# Patient Record
Sex: Female | Born: 1980 | Race: Asian | Hispanic: No | Marital: Married | Smoking: Never smoker
Health system: Southern US, Community
[De-identification: ages and names within clinical notes are randomized; demographics above are authoritative.]

## PROBLEM LIST (undated history)

## (undated) DIAGNOSIS — E039 Hypothyroidism, unspecified: Secondary | ICD-10-CM

---

## 2002-01-30 ENCOUNTER — Inpatient Hospital Stay (HOSPITAL_COMMUNITY): Admission: EM | Admit: 2002-01-30 | Discharge: 2002-01-31 | Payer: Self-pay | Admitting: *Deleted

## 2008-01-26 ENCOUNTER — Other Ambulatory Visit: Admission: RE | Admit: 2008-01-26 | Discharge: 2008-01-26 | Payer: Self-pay | Admitting: Obstetrics & Gynecology

## 2008-04-09 ENCOUNTER — Emergency Department (HOSPITAL_COMMUNITY): Admission: EM | Admit: 2008-04-09 | Discharge: 2008-04-09 | Payer: Self-pay | Admitting: Emergency Medicine

## 2008-05-22 ENCOUNTER — Emergency Department (HOSPITAL_COMMUNITY): Admission: EM | Admit: 2008-05-22 | Discharge: 2008-05-22 | Payer: Self-pay | Admitting: Emergency Medicine

## 2008-07-17 ENCOUNTER — Ambulatory Visit: Payer: Self-pay | Admitting: Obstetrics & Gynecology

## 2008-07-17 ENCOUNTER — Inpatient Hospital Stay (HOSPITAL_COMMUNITY): Admission: AD | Admit: 2008-07-17 | Discharge: 2008-07-21 | Payer: Self-pay | Admitting: Obstetrics & Gynecology

## 2008-07-18 ENCOUNTER — Encounter: Payer: Self-pay | Admitting: Obstetrics & Gynecology

## 2010-04-19 ENCOUNTER — Ambulatory Visit (HOSPITAL_COMMUNITY): Admission: RE | Admit: 2010-04-19 | Discharge: 2010-04-19 | Payer: Self-pay | Admitting: Obstetrics & Gynecology

## 2010-10-22 ENCOUNTER — Encounter: Payer: Self-pay | Admitting: Internal Medicine

## 2011-02-10 IMAGING — US US OB NUCHAL TRANSLUCENCY 1ST GEST
1 series · 14 of 28 positions shown · non-contrast
Comparison: none

OBSTETRICAL ULTRASOUND:
 This ultrasound was performed in The [HOSPITAL], and the [facility] report will be stored to [REDACTED] PACS.  This report is also available in [HOSPITAL]?s accessANYware.

[Series 1: us ob nuchal translucency 1st gest · 0.24mm/px · 14 of 41 slices shown]
[im 2/41]
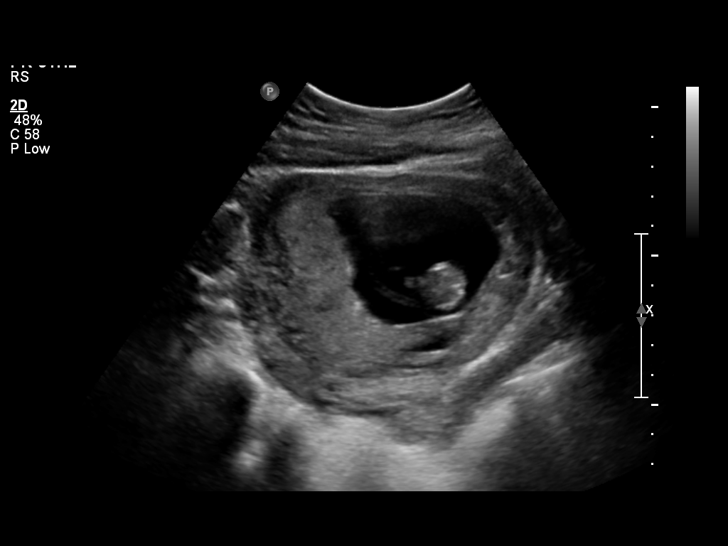
[im 5/41]
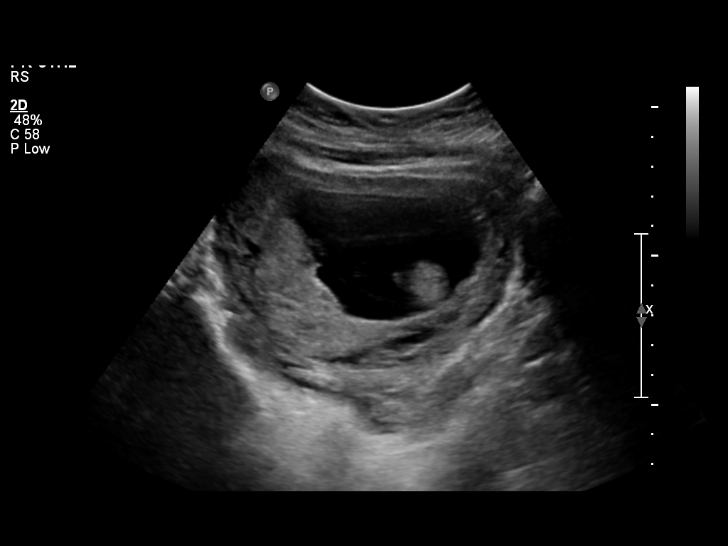
[im 8/41]
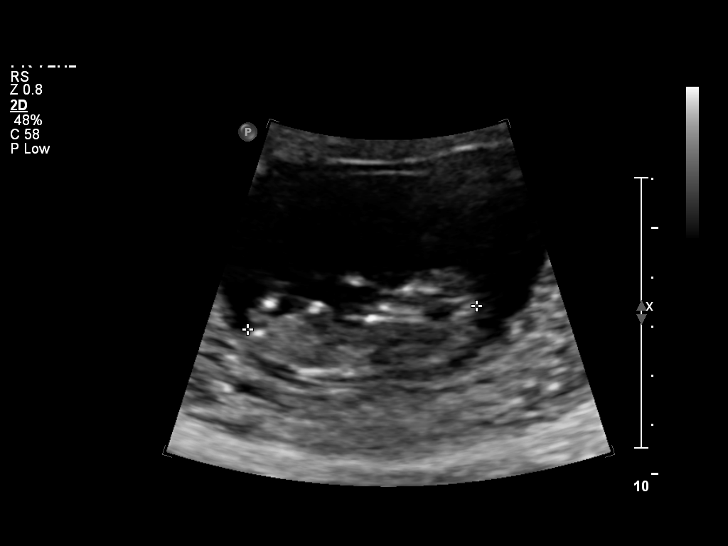
[im 11/41]
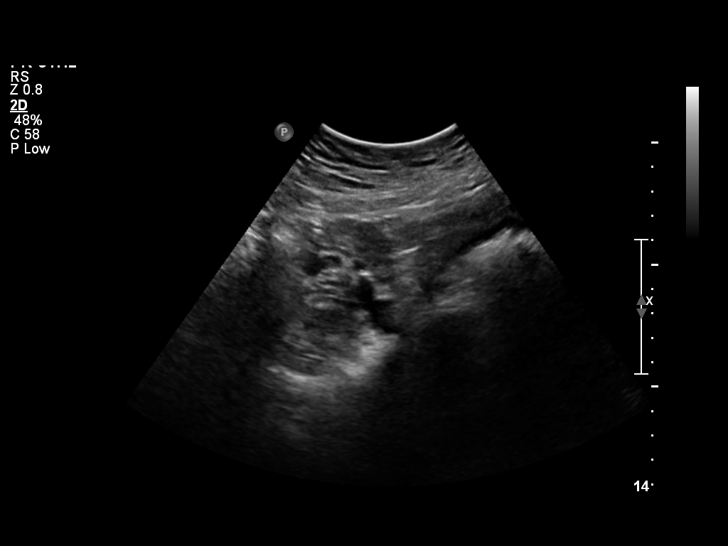
[im 14/41]
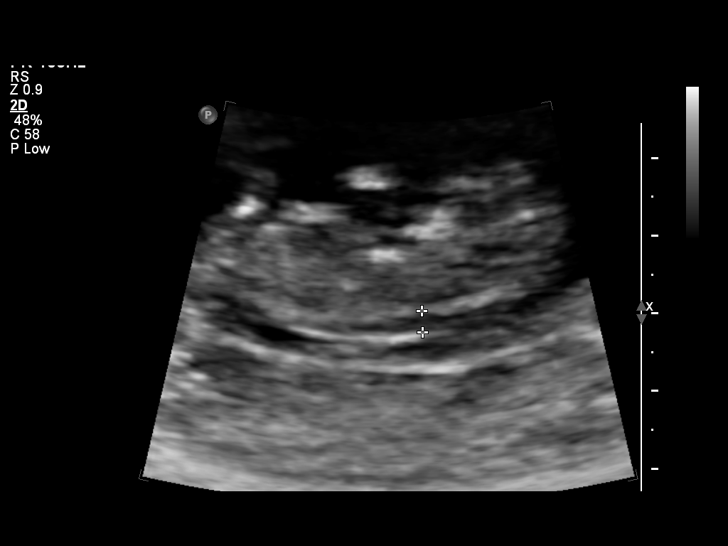
[im 17/41]
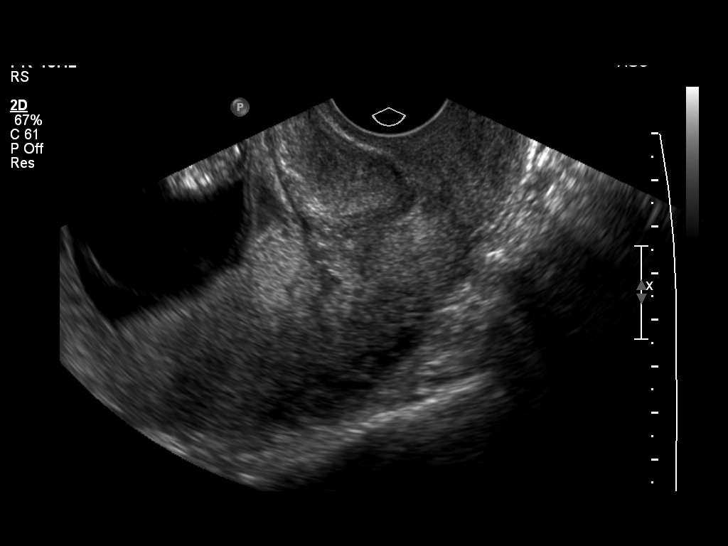
[im 20/41]
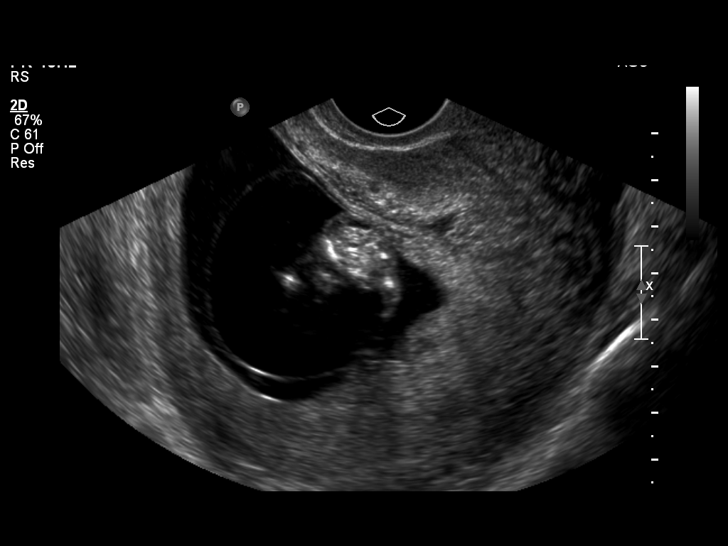
[im 23/41]
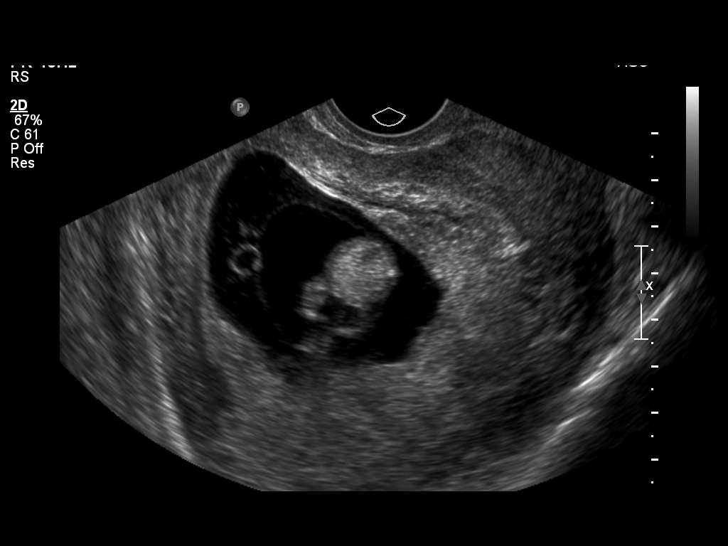
[im 26/41]
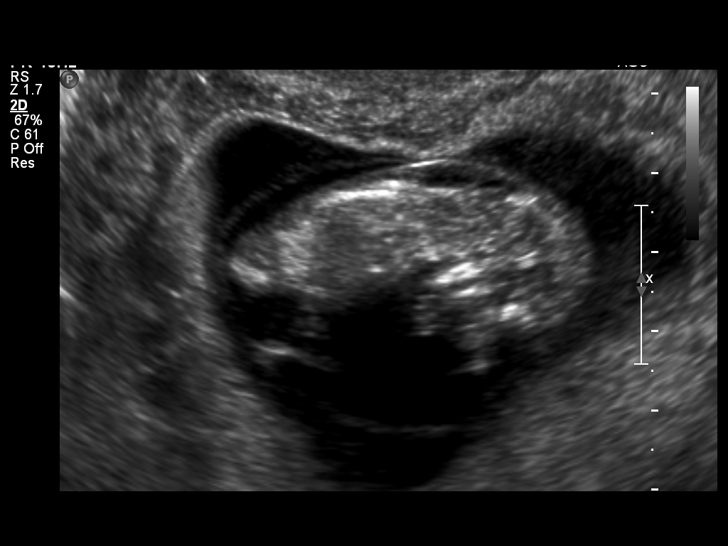
[im 29/41]
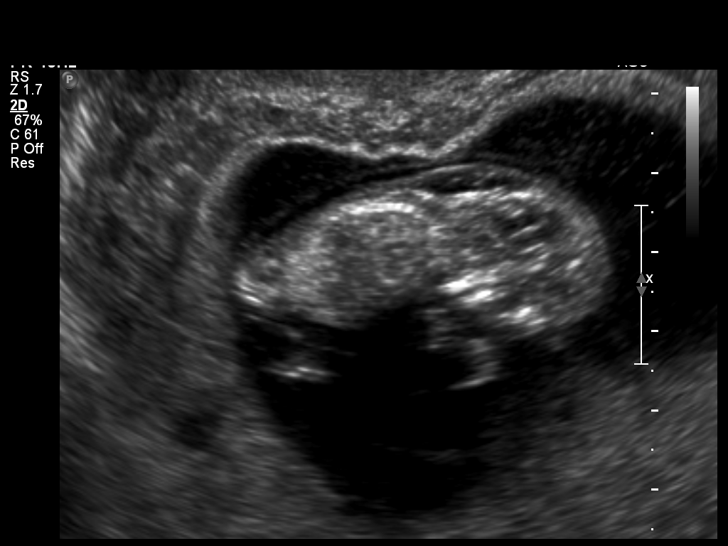
[im 32/41]
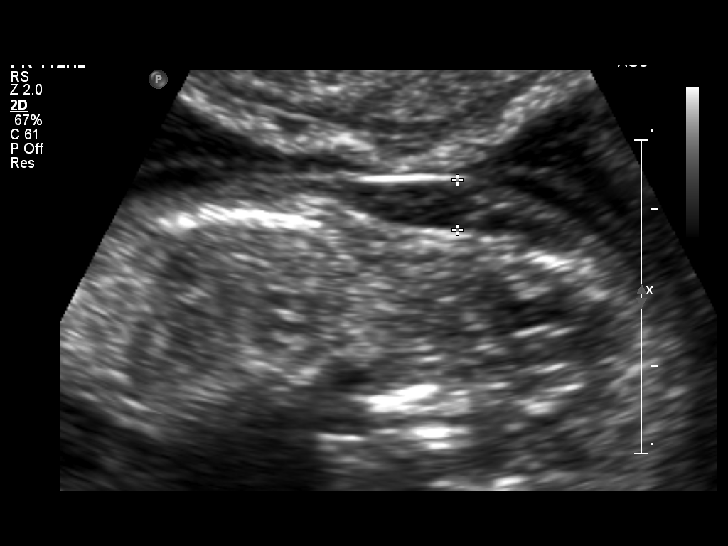
[im 35/41]
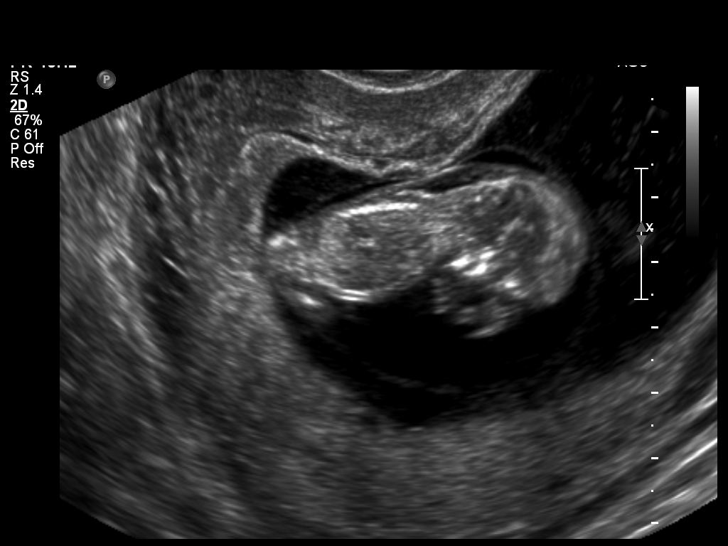
[im 38/41]
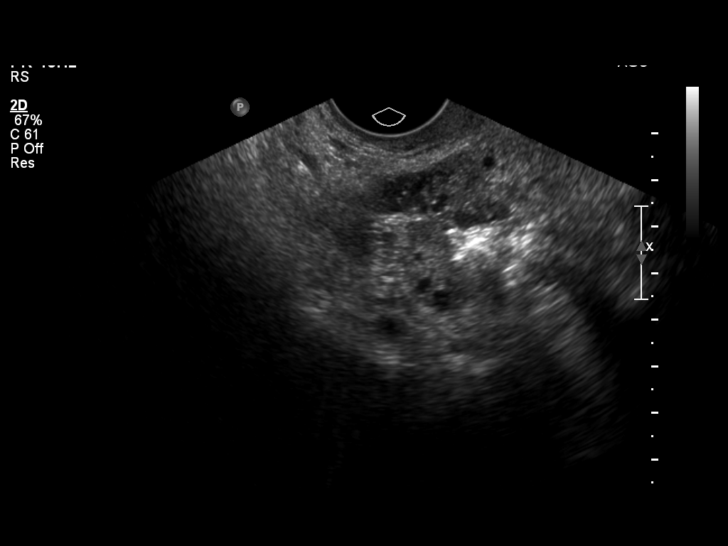
[im 41/41]
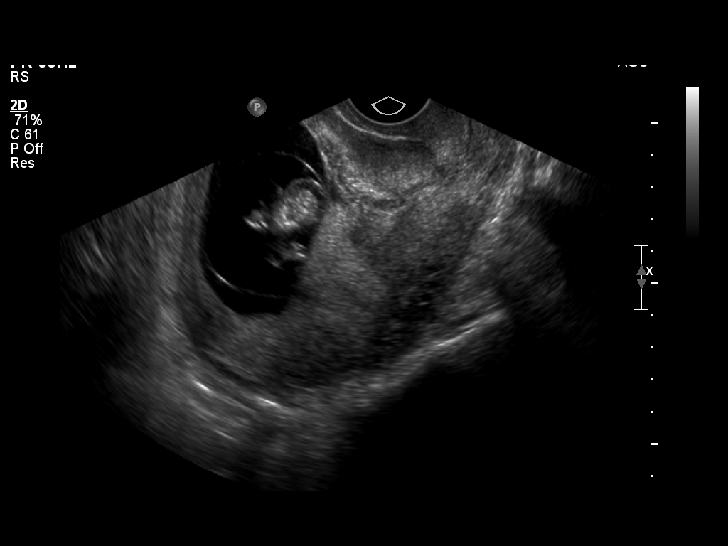

[14 of 28 positions shown; findings below may reference images not displayed]

IMPRESSION: [facility] has also been faxed to the ordering physician.

## 2011-02-13 NOTE — Op Note (Signed)
NAMEWINEFRED, Laura Berry NO.:  [id number]   MEDICAL RECORD NO.:  [id number]          PATIENT TYPE:  INP   LOCATION:  9164                          FACILITY:  [facility]   PHYSICIAN:  Lesly Dukes, M.D. DATE OF BIRTH:  20-Jan-1981   DATE OF PROCEDURE:  07/18/2008  DATE OF DISCHARGE:                               OPERATIVE REPORT   PREOPERATIVE DIAGNOSES:  A 30 year old  para 0 with failure to descent  after 3 hours of pushing and chorioamnionitis.   POSTOPERATIVE DIAGNOSES:  A 30 year old para 0 with failure to descent  after 3 hours of pushing and chorioamnionitis.   PROCEDURE:  Primary low-transverse cesarean section.   SURGEON:  Lesly Dukes, MD   ANESTHESIA:  Epidural.   SPECIMEN:  Placenta to Pathology.   FINDINGS:  Viable female infant, vertex presentation, occiput anterior.   ESTIMATED BLOOD LOSS:  1000 mL.   COMPLICATIONS:  None.   DESCRIPTION OF PROCEDURE:  After informed consent was obtained, the was  taken to the operating room where epidural anesthesia was found to be  adequate.  The patient was placed in dorsal supine position with  leftward tilt and prepped and draped in normal sterile fashion.  A Foley  was already in the bladder.  A Pfannenstiel skin incision was made with  a scalpel and carried down to fascia.  The fascia was incised in the  midline, this incision was extended bilaterally with Mayo scissors.  The  superior and inferior aspects of the fascial incision were grasped with  Kocher clamps, tented up, and dissected off sharply and bluntly from  underlying layers of rectus muscles.  The rectus muscles were separated  in the midline.  The peritoneum was entered bluntly and this incision  was extended both superiorly and inferiorly with good visualization of  the bladder.  The bladder blade was inserted and a bladder flap was  created.  The bladder blade was reinserted.  The lower uterine segment  was incised in a transverse fashion.   This incision was entered  bilaterally bluntly.  The head delivered direct OA without incident.  Nose and mouth were suctioned.  Rest of the baby's body delivered  easily.  The cord was clamped and cut.  The baby was handed off to the  awaiting pediatrician.  Placenta delivered manually and had a 3-vessel  cord.  The uterus was cleared of all clots and debris and was  exteriorized.  The uterine incision was closed with 0 Vicryl in a  running locked fashion with good hemostasis.  A second suture of chromic  was used to reinforce the incision and aid in hemostasis.  The  peritoneal cavity was copiously irrigated and there was still a small  amount of oozing near the bladder flap in lower uterine segment so  sutures of chromic and Vicryl were used to help aid in hemostasis.  A  Surgicel was placed in this area.  Again the uterus was noted to be  hemostatic at this point.  The rectus muscles and peritoneum appeared to  be hemostatic.  The fascia  was closed 0 Vicryl in a running fashion with  good hemostasis.  Subcutaneous tissue was  copiously irrigated and found to be hemostatic.  Skin was closed with  staples and a pressure dressing was placed in the abdomen.  The patient  tolerated the procedure well.  Sponge, lap, instrument, and needle count  were all correct x2.  The patient went to the recovery room in stable  condition.      Lesly Dukes, M.D.  Electronically Signed     KHL/MEDQ  D:  07/18/2008  T:  07/18/2008  Job:  [id number]

## 2011-02-13 NOTE — Discharge Summary (Signed)
NAMEINEZE, SERRAO NO.:  [id number]   MEDICAL RECORD NO.:  [id number]          PATIENT TYPE:  INP   LOCATION:  9121                          FACILITY:  [facility]   PHYSICIAN:  Lesly Dukes, M.D. DATE OF BIRTH:  [birth year]   DATE OF ADMISSION:  07/17/2008  DATE OF DISCHARGE:                               DISCHARGE SUMMARY   REASON FOR ADMISSION:  The patient is a gravida 1 presented at 38-4/7th  weeks in active labor.  She labored for quite a while, pushed 3 hours,  baby never came past, +2 station, and she developed a fever, so she  received primary low transverse cesarean section for failure to descend  and chorioamnionitis.   PERTINENT LABORATORY FINDINGS:  Upon admission, her white blood cell  count was 10.0.  At discharge is 13.5.  H&H on admission 11.4 and 34.2.  Discharge H&H is 9.6 and 28.6.  She is afebrile.  The C-section was  performed by Dr. Elsie Lincoln.  See operative report for details on  that.  There were no complications.  The patient's postop course has  been uneventful except for some mild wheezes, which have decreased  throughout her hospital stay.  She has no other respiratory symptoms and  no shortness of breath.  O2 sats were within normal limits.  See  progress note for this morning's physical exam.  The patient is in  stable condition to be discharged home.  She has been given written and  verbal instructions and on breast-feeding and postoperative care.  Please see med rec sheet for medications.  Her staples are going to be  removed today and she is going to follow up with [facility] in  next week.      Jacklyn Shell, C.N.M.      Lesly Dukes, M.D.  Electronically Signed    FC/MEDQ  D:  07/21/2008  T:  07/21/2008  Job:  [id number]   cc:   [facility]

## 2011-02-16 NOTE — Consult Note (Signed)
[facility]  Patient:    Laura Berry, Laura Berry Visit Number: [id number] MRN: [id number]          Service Type: MED Location: 3A A311 01 Attending Physician:  Teena Dunk Hor Dictated by:   Franky Macho, M.D. Proc. Date: 01/30/02 Admit Date:  01/30/2002 Discharge Date: 01/31/2002   CC:         Dr. Corey Skains, hospital list   Consultation Report  AGE:  30 years old  REASON FOR CONSULTATION:  Arthropod bite right forearm.  HISTORY OF PRESENT ILLNESS:  The patient is a 30 year old white female while sleeping last night, developed right forearm and hand swelling when she woke up this morning.  She was noted to have an arthropod bite just proximal to the wrist.  She now presents to the emergency room due to worsening swelling.  She states that she has some tingling sensations in her finger, but is in no pain.  She denies any fever.  She is otherwise healthy.  PAST MEDICAL HISTORY:  Unremarkable.  PAST SURGICAL HISTORY:  Unremarkable.  CURRENT MEDICATIONS:  None.  ALLERGIES:  No known drug allergies.  REVIEW OF SYSTEMS:  Unremarkable.  PHYSICAL EXAMINATION:  GENERAL:  The patient is a pleasant 30 year old female in no acute distress.  VITAL SIGNS:  She is afebrile and vital signs are stable.  EXTREMITIES:  Swollen right hand, wrist, and forearm.  Good radial and ulnar pulses are noted.  She has good capillary refill.  She has full movement of both the wrists and fingers in the right hand.  She does have an ecchymotic area around an arthropod bite that is along the radial aspect of the forearm. No fluctuant area is noted.  IMPRESSION:  Arthropod bite, right forearm.  At this point, I do not think she has an abscess cavity that requires incision and drainage.  She does not have evidence of compartmental syndrome or a necrotizing tendinitis type process. I suspect that this is a spider bite with resulting subcutaneous necrosis.  It appears to be mild in  nature.  PLAN:  Agree with admission to the hospital for neurovascular right arm checks, right arm elevation, and intravenous Ancef.  A CBC has been ordered but is not back yet.  I will follow the patient closely with you. Dictated by:   Franky Macho, M.D. Attending Physician:  Teena Dunk [facility] DD:  01/30/02 TD:  02/01/02 Job: [id number]

## 2011-02-16 NOTE — Discharge Summary (Signed)
[facility]  Patient:    Laura Berry, Laura Berry Visit Number: [id number] MRN: [id number]          Service Type: MED Location: 3A A311 01 Attending Physician:  Teena Dunk Hor Dictated by:   Shellia Carwin, M.D. Admit Date:  01/30/2002 Discharge Date: 01/31/2002   CC:         Franky Macho, M.D.   Discharge Summary  DIAGNOSES: 1. Cellulitis. 2. Insect bite.  CONSULTATIONS:  Dr. Lovell Sheehan, surgery.  HISTORY OF PRESENT ILLNESS:  The patient is a 30 year old female who presented to the emergency room on Jan 30, 2002, complaining of swelling and redness on her right hand.  The patient had had an insect bite the night before.  The patient was found to have a pustule over her right wrist with extensive erythema and edema of her right forearm.  She was admitted for cellulitis. For details of admission, please see dictated H&P.  PAST MEDICAL HISTORY:  None.  ADMISSION MEDICATIONS:  None.  PHYSICAL EXAMINATION:  At the time of admission the patient was afebrile, blood pressure 117/40, pulse 90, respirations 18.  Cardiac rhythm was regular. Chest and lungs were clear.  Right arm had a pustule over the right wrist with clear fluid expressed.  She had erythema and edema extending over the anterior forearm.  There was no axillary adenopathy.  Ulnar and radial pulses were intact.  HOSPITAL COURSE:  The patient was admitted to a medical bed.  She was given IV Ancef and p.r.n. Benadryl.  Dr. Lovell Sheehan from surgery was consulted, who felt no surgical intervention at this time and recommended keeping arm elevated and IV antibiotics.  Laboratories performed during the patients hospitalization included a white count which was 6.8, hemoglobin 13.1, hematocrit 37.2, and platelets of 232.  Chemistry panel was within normal limits.  The patient remained afebrile during her hospitalization, and the erythema and edema significantly improved.  The patient was neurovascularly  intact.  As symptoms have clinically improved, arrangements are in progress for the patient to be discharged home.  She will be given a prescription for Keflex 500 mg q.i.d. for seven days of antibiotic therapy.  If symptoms fail to resolve or worsen in any way, she is to contact Dr. Lovell Sheehan office for follow-up.Dictated by: Shellia Carwin, M.D. Attending Physician:  Teena Dunk [facility] DD:  01/31/02 TD:  02/01/02 Job: [id number]

## 2011-02-16 NOTE — H&P (Signed)
[facility]  Patient:    Laura Berry, Laura Berry Visit Number: [id number] MRN: [id number]          Service Type: MED Location: 3A A311 01 Attending Physician:  Corlis Leak. Admit Date:  01/30/2002                           History and Physical  HISTORY OF PRESENT ILLNESS:  The patient is a 30 year old [place] female who presents to the emergency room complaining of a bug bite to her right arm, now with arm swelling and redness.  The patient states that she noticed an insect bite last night over her right wrist.  She did not note what type of bug it was.  She noticed this morning that her arm had become red and swollen, and the redness and swelling seems to be progressive.  She denies any fever or chills.  She denies any difficulty speaking.  She denies any difficulty swallowing, and she denies any shortness of breath.  She has no history of any types of allergies to bee stings or insect bites.  PAST MEDICAL HISTORY:  None.  CURRENT MEDICATIONS:  None.  ALLERGIES:  None.  SOCIAL HISTORY:  The patient denies tobacco use.  She denies alcohol use.  She denies recreational drug use.  The patient is a Consulting civil engineer and works part-time.  FAMILY HISTORY:  The patient is unaware of any medical problems in her family.  REVIEW OF SYSTEMS:  No headaches or visual complaints.  No dysphagia or odynophagia.  No chest pain, shortness of breath.  No nausea, vomiting, or abdominal pain.  No neurologic complaints.  PHYSICAL EXAMINATION:  GENERAL:  Well-developed, well-nourished female in no acute distress.  VITAL SIGNS:  Temperature 97.9, pulse 90, respirations 18, blood pressure 117/40.  HEENT:  Pupils appear equal and reactive to light.  Oropharynx is clear. Tonsils are prominent.  There is no erythema, edema, or any oral lesions noted.  Tongue is midline.  NECK:  Supple.  No adenopathy is noted.  CHEST, LUNGS:  Clear.  No accessory muscle usage is noted.  CARDIAC:   Rhythm is regular.  ABDOMEN:  Nontender.  EXTREMITIES:  No edema is noted of the lower extremities.  Right arm:  Ulnar and radial pulses are intact.  Right hand is somewhat cool to touch.  There is a small pustule over the right wrist, and there is clear fluid expressed. There is significant erythema which extends over the anterior forearm to the antecubital fossa.  There is no axillary adenopathy noted.  Sensation does appear to be intact to light touch, and grip strength is good.  LABORATORY DATA:  Pending at the time of this dictation.  ASSESSMENT AND PLAN:  Cellulitis/insect bite with rapidly progressing erythema:  The patient has been given Ancef and Benadryl in the emergency room.  Dr. Lovell Sheehan has been consulted.  He does not feel that there is any acute need for surgical intervention although does feel that the patient needs admission and IV antibiotics and following of her neurovascular status.  At this time will admit to a medical floor.  Start on IV Ancef.  Keep her arm elevated and have neurovascular checks.  A CBC and chemistry panel have also been ordered, and these will be followed up on. Attending Physician:  Corlis Leak DD:  01/30/02 TD:  01/31/02 Job: [id number]

## 2011-06-28 LAB — DIFFERENTIAL
Basophils Relative: 0
Lymphocytes Relative: 16
Monocytes Absolute: 0.6

## 2011-06-28 LAB — CBC
HCT: 31.6 — ABNORMAL LOW
Hemoglobin: 10.8 — ABNORMAL LOW
MCHC: 34.2
MCV: 88.1
RBC: 3.59 — ABNORMAL LOW
RDW: 13.3
WBC: 7.8

## 2011-07-03 LAB — CBC
HCT: 34.2 — ABNORMAL LOW
Hemoglobin: 11.4 — ABNORMAL LOW
MCV: 86.5
MCV: 87.4
Platelets: 214
WBC: 10
WBC: 13.5 — ABNORMAL HIGH

## 2011-07-03 LAB — URINALYSIS, ROUTINE W REFLEX MICROSCOPIC
Bilirubin Urine: NEGATIVE
Leukocytes, UA: NEGATIVE
Specific Gravity, Urine: 1.01
pH: 7

## 2011-07-03 LAB — URINE MICROSCOPIC-ADD ON

## 2013-08-20 ENCOUNTER — Other Ambulatory Visit (HOSPITAL_COMMUNITY): Payer: Self-pay | Admitting: Family Medicine

## 2013-08-20 DIAGNOSIS — Z3480 Encounter for supervision of other normal pregnancy, unspecified trimester: Secondary | ICD-10-CM

## 2013-08-21 ENCOUNTER — Ambulatory Visit (HOSPITAL_COMMUNITY): Payer: BC Managed Care – PPO

## 2013-08-21 ENCOUNTER — Ambulatory Visit (HOSPITAL_COMMUNITY): Admission: RE | Admit: 2013-08-21 | Payer: BC Managed Care – PPO | Source: Ambulatory Visit

## 2013-10-24 ENCOUNTER — Inpatient Hospital Stay (HOSPITAL_COMMUNITY)
Admission: AD | Admit: 2013-10-24 | Discharge: 2013-10-24 | Disposition: A | Discharge status: Home / Self Care | Payer: BC Managed Care – PPO | Source: Ambulatory Visit | Attending: Obstetrics & Gynecology | Admitting: Obstetrics & Gynecology

## 2013-10-24 ENCOUNTER — Encounter (HOSPITAL_COMMUNITY): Payer: Self-pay | Admitting: *Deleted

## 2013-10-24 DIAGNOSIS — D649 Anemia, unspecified: Secondary | ICD-10-CM | POA: Insufficient documentation

## 2013-10-24 DIAGNOSIS — N898 Other specified noninflammatory disorders of vagina: Secondary | ICD-10-CM

## 2013-10-24 DIAGNOSIS — IMO0002 Reserved for concepts with insufficient information to code with codable children: Secondary | ICD-10-CM | POA: Insufficient documentation

## 2013-10-24 DIAGNOSIS — N938 Other specified abnormal uterine and vaginal bleeding: Secondary | ICD-10-CM

## 2013-10-24 DIAGNOSIS — Z8759 Personal history of other complications of pregnancy, childbirth and the puerperium: Secondary | ICD-10-CM

## 2013-10-24 HISTORY — DX: Hypothyroidism, unspecified: E03.9

## 2013-10-24 LAB — CBC
HEMATOCRIT: 28.5 % — AB (ref 36.0–46.0)
Hemoglobin: 9.4 g/dL — ABNORMAL LOW (ref 12.0–15.0)
MCH: 27.7 pg (ref 26.0–34.0)
MCHC: 33 g/dL (ref 30.0–36.0)
MCV: 84.1 fL (ref 78.0–100.0)
Platelets: 251 10*3/uL (ref 150–400)
RBC: 3.39 MIL/uL — ABNORMAL LOW (ref 3.87–5.11)
RDW: 12.5 % (ref 11.5–15.5)
WBC: 5.3 10*3/uL (ref 4.0–10.5)

## 2013-10-24 LAB — URINALYSIS, ROUTINE W REFLEX MICROSCOPIC
Bilirubin Urine: NEGATIVE
GLUCOSE, UA: NEGATIVE mg/dL
KETONES UR: NEGATIVE mg/dL
LEUKOCYTES UA: NEGATIVE
NITRITE: NEGATIVE
PH: 5.5 (ref 5.0–8.0)
PROTEIN: NEGATIVE mg/dL
SPECIFIC GRAVITY, URINE: 1.01 (ref 1.005–1.030)
UROBILINOGEN UA: 0.2 mg/dL (ref 0.0–1.0)

## 2013-10-24 LAB — URINE MICROSCOPIC-ADD ON

## 2013-10-24 LAB — WET PREP, GENITAL
Clue Cells Wet Prep HPF POC: NONE SEEN
Trich, Wet Prep: NONE SEEN
Yeast Wet Prep HPF POC: NONE SEEN

## 2013-10-24 LAB — POCT PREGNANCY, URINE: Preg Test, Ur: POSITIVE — AB

## 2013-10-24 LAB — ABO/RH: ABO/RH(D): A POS

## 2013-10-24 LAB — HCG, QUANTITATIVE, PREGNANCY: hCG, Beta Chain, Quant, S: 21 m[IU]/mL — ABNORMAL HIGH (ref <5)

## 2013-10-24 NOTE — MAU Note (Signed)
Pt. Here due to heaving bleeding. Had TAB 08/31/13. Took a pregnancy test after 3 weeks and it was still positive. Went back to provider and at that time used Cytotec in the vagina and an ultrasound. States there was still a clot in the vagina. Went back Jan 12 and provider did another ultrasound and repeated the medication in the vagina. Did bleed for a couple of days after that. Having light bleeding until today and noticed a large amount and clots. Here for evaluation.

## 2013-10-24 NOTE — Discharge Instructions (Signed)

## 2013-10-24 NOTE — MAU Provider Note (Signed)
History     CSN: [id number]  Arrival date and time: 10/24/13 1502   First Provider Initiated Contact with Patient 10/24/13 1626      Chief Complaint  Patient presents with  . Vaginal Bleeding   HPI  Ms. Laura Berry is a 33 y.o. female G2P0011 at Unknown gestation who presents with abnormal vaginal bleeding following a therapeutic abortion she had on 08/31/2013. She went back for a follow up appointment on 12/12 and they told her she still had a "clot", at that time patient received cytotec. Following cytotec she went back for a follow up and the Dr. Catalina Pizzaold her she had nothing in her uterus. She is here today because she has not stopped bleeding following the cytotec. Pt feels the bleeding is very heavy.  Patient has changed 5 pads since noon.  Abortion was done at [facility].   OB History   Grav Para Term Preterm Abortions TAB SAB Ect Mult Living   2    1 1    1       Past Medical History  Diagnosis Date  . Hypothyroidism     Past Surgical History  Procedure Laterality Date  . Cesarean section      History reviewed. No pertinent family history.  History  Substance Use Topics  . Smoking status: Never Smoker   . Smokeless tobacco: Not on file  . Alcohol Use: No    Allergies: No Known Allergies  Prescriptions prior to admission  Medication Sig Dispense Refill  . levothyroxine (SYNTHROID, LEVOTHROID) 100 MCG tablet Take 100 mcg by mouth daily before breakfast.       Results for orders placed during the hospital encounter of 10/24/13 (from the past 48 hour(s))  URINALYSIS, ROUTINE W REFLEX MICROSCOPIC     Status: Abnormal   Collection Time    10/24/13  3:32 PM      Result Value Range   Color, Urine YELLOW  YELLOW   APPearance HAZY (*) CLEAR   Specific Gravity, Urine 1.010  1.005 - 1.030   pH 5.5  5.0 - 8.0   Glucose, UA NEGATIVE  NEGATIVE mg/dL   Hgb urine dipstick LARGE (*) NEGATIVE   Bilirubin Urine NEGATIVE  NEGATIVE   Ketones, ur NEGATIVE  NEGATIVE  mg/dL   Protein, ur NEGATIVE  NEGATIVE mg/dL   Urobilinogen, UA 0.2  0.0 - 1.0 mg/dL   Nitrite NEGATIVE  NEGATIVE   Leukocytes, UA NEGATIVE  NEGATIVE  URINE MICROSCOPIC-ADD ON     Status: Abnormal   Collection Time    10/24/13  3:32 PM      Result Value Range   Squamous Epithelial / LPF FEW (*) RARE   WBC, UA 0-2  <3 WBC/hpf   RBC / HPF 0-2  <3 RBC/hpf  CBC     Status: Abnormal   Collection Time    10/24/13  3:55 PM      Result Value Range   WBC 5.3  4.0 - 10.5 K/uL   RBC 3.39 (*) 3.87 - 5.11 MIL/uL   Hemoglobin 9.4 (*) 12.0 - 15.0 g/dL   HCT 40.928.5 (*) 81.136.0 - 91.446.0 %   MCV 84.1  78.0 - 100.0 fL   MCH 27.7  26.0 - 34.0 pg   MCHC 33.0  30.0 - 36.0 g/dL   RDW 78.212.5  95.611.5 - 21.315.5 %   Platelets 251  150 - 400 K/uL  HCG, QUANTITATIVE, PREGNANCY     Status: Abnormal   Collection Time  10/24/13  3:55 PM      Result Value Range   hCG, Beta Chain, Quant, S 21 (*) <5 mIU/mL   Comment:              GEST. AGE      CONC.  (mIU/mL)       <=1 WEEK        5 - 50         2 WEEKS       50 - 500         3 WEEKS       100 - 10,000         4 WEEKS     1,000 - 30,000         5 WEEKS     3,500 - 115,000       6-8 WEEKS     12,000 - 270,000        12 WEEKS     15,000 - 220,000                FEMALE AND NON-PREGNANT FEMALE:         LESS THAN 5 mIU/mL  ABO/RH     Status: None   Collection Time    10/24/13  3:55 PM      Result Value Range   ABO/RH(D) A POS    POCT PREGNANCY, URINE     Status: Abnormal   Collection Time    10/24/13  4:42 PM      Result Value Range   Preg Test, Ur POSITIVE (*) NEGATIVE   Comment:            THE SENSITIVITY OF THIS     METHODOLOGY IS >24 mIU/mL  WET PREP, GENITAL     Status: Abnormal   Collection Time    10/24/13  4:45 PM      Result Value Range   Yeast Wet Prep HPF POC NONE SEEN  NONE SEEN   Trich, Wet Prep NONE SEEN  NONE SEEN   Clue Cells Wet Prep HPF POC NONE SEEN  NONE SEEN   WBC, Wet Prep HPF POC FEW (*) NONE SEEN   Comment: FEW BACTERIA SEEN       Review of Systems  Constitutional: Negative for fever and chills.  Eyes: Negative for blurred vision.  Gastrointestinal: Negative for nausea, vomiting and abdominal pain.  Genitourinary: Negative for dysuria, urgency, frequency, hematuria and flank pain.       No vaginal discharge. + vaginal bleeding. No dysuria.   Neurological: Positive for dizziness.   Physical Exam   Blood pressure 119/70, pulse 102, temperature 97.8 F (36.6 C), temperature source Oral, resp. rate 18, height 5\' 4"  (1.626 m), weight 58.06 kg (128 lb), last menstrual period 10/24/2013.  Physical Exam  Constitutional: She is oriented to person, place, and time. She appears well-developed and well-nourished. No distress.  HENT:  Head: Normocephalic.  Eyes: Pupils are equal, round, and reactive to light.  Neck: Neck supple.  GI: Soft. She exhibits no distension and no mass. There is no tenderness. There is no rebound and no guarding.  Genitourinary:  Speculum exam: Vagina - Moderate amount of dark red blood in canal, no odor Cervix - small amount of active bleeding. One small clot pulled from the cervix using ring forcep.  Bimanual exam: Cervix FT Uterus non tender, normal size Adnexa non tender, no masses bilaterally GC/Chlam, wet prep done Chaperone present for exam.   Neurological: She is  alert and oriented to person, place, and time.  Skin: Skin is warm. She is not diaphoretic.  Psychiatric: Her behavior is normal.    MAU Course  Procedures None  MDM CBC  A positive blood type  Bleeding likely related to TAB in December; pt's bleeding is stable.  Assessment and Plan   A:  1. Moderate vaginal bleeding   2. Abortion history   3. Anemia    P:  Discharge home in stable condition  Follow up in the clinic in 1 week for beta hcg and CBC; referral sent  Start iron or prenatal vitamin Bleeding precautions discussed at length   Iona Hansen Blandina Renaldo, NP 10/24/2013, 6:40 PM

## 2013-10-24 NOTE — MAU Note (Signed)
Pt states began bleeding heavy today. When voiding sees a lot of very dark blood. Is cramping. Had TAB 08/31/2013, and had to return because she still had "clot" in her uterus. Was given medicine to help pass "clot" 10/12/2013. Was then told to return in three weeks and that bleeding would be normal. Pt states this bleeding is very heavy and scares her.

## 2013-10-26 LAB — GC/CHLAMYDIA PROBE AMP
CT PROBE, AMP APTIMA: NEGATIVE
GC Probe RNA: NEGATIVE

## 2013-10-30 ENCOUNTER — Other Ambulatory Visit: Payer: BC Managed Care – PPO

## 2013-10-30 DIAGNOSIS — D649 Anemia, unspecified: Secondary | ICD-10-CM

## 2013-10-30 DIAGNOSIS — Z8759 Personal history of other complications of pregnancy, childbirth and the puerperium: Secondary | ICD-10-CM

## 2013-10-30 DIAGNOSIS — N898 Other specified noninflammatory disorders of vagina: Secondary | ICD-10-CM

## 2013-10-30 LAB — CBC
HCT: 30.7 % — ABNORMAL LOW (ref 36.0–46.0)
HEMOGLOBIN: 9.8 g/dL — AB (ref 12.0–15.0)
MCH: 27.5 pg (ref 26.0–34.0)
MCHC: 31.9 g/dL (ref 30.0–36.0)
MCV: 86 fL (ref 78.0–100.0)
Platelets: 284 10*3/uL (ref 150–400)
RBC: 3.57 MIL/uL — ABNORMAL LOW (ref 3.87–5.11)
RDW: 13.6 % (ref 11.5–15.5)
WBC: 3.2 10*3/uL — ABNORMAL LOW (ref 4.0–10.5)

## 2013-10-30 LAB — HCG, QUANTITATIVE, PREGNANCY: HCG, BETA CHAIN, QUANT, S: 19.1 m[IU]/mL

## 2013-12-14 ENCOUNTER — Encounter: Payer: BC Managed Care – PPO | Admitting: Medical

## 2014-06-11 ENCOUNTER — Ambulatory Visit (INDEPENDENT_AMBULATORY_CARE_PROVIDER_SITE_OTHER): Payer: BC Managed Care – PPO | Admitting: Family Medicine

## 2014-06-11 VITALS — BP 106/56 | HR 78 | Temp 97.7°F | Resp 16 | Ht 62.0 in | Wt 130.0 lb

## 2014-06-11 DIAGNOSIS — Z3490 Encounter for supervision of normal pregnancy, unspecified, unspecified trimester: Secondary | ICD-10-CM

## 2014-06-11 DIAGNOSIS — Z331 Pregnant state, incidental: Secondary | ICD-10-CM

## 2014-06-11 DIAGNOSIS — N912 Amenorrhea, unspecified: Secondary | ICD-10-CM

## 2014-06-11 LAB — POCT URINE PREGNANCY: PREG TEST UR: POSITIVE

## 2014-06-11 NOTE — Patient Instructions (Addendum)
Take a prenatal vitamin  Referral will be made for you to [facility]  Check with pharmacist before taking any medications  Avoid alcohol  Get some regular exercise  Return or go to [facility] if problems arise.

## 2014-06-11 NOTE — Progress Notes (Signed)
Subjective:     Results for orders placed in visit on 06/11/14  POCT URINE PREGNANCY      Result Value Ref Range   Preg Test, Ur Positive

## 2014-07-01 ENCOUNTER — Other Ambulatory Visit: Payer: Self-pay | Admitting: Obstetrics and Gynecology

## 2020-03-07 ENCOUNTER — Other Ambulatory Visit (HOSPITAL_COMMUNITY): Payer: Self-pay | Admitting: Family Medicine

## 2020-03-07 DIAGNOSIS — N6320 Unspecified lump in the left breast, unspecified quadrant: Secondary | ICD-10-CM

## 2022-06-13 DIAGNOSIS — Z0001 Encounter for general adult medical examination with abnormal findings: Secondary | ICD-10-CM | POA: Diagnosis not present

## 2022-06-13 DIAGNOSIS — E039 Hypothyroidism, unspecified: Secondary | ICD-10-CM | POA: Diagnosis not present

## 2022-08-08 ENCOUNTER — Other Ambulatory Visit (HOSPITAL_COMMUNITY): Payer: Self-pay | Admitting: Family Medicine

## 2022-08-08 DIAGNOSIS — Z1231 Encounter for screening mammogram for malignant neoplasm of breast: Secondary | ICD-10-CM

## 2022-09-03 ENCOUNTER — Ambulatory Visit (HOSPITAL_COMMUNITY)
Admission: RE | Admit: 2022-09-03 | Discharge: 2022-09-03 | Disposition: A | Discharge status: Home / Self Care | Payer: 59 | Source: Ambulatory Visit | Attending: Family Medicine | Admitting: Family Medicine

## 2022-09-03 DIAGNOSIS — Z1231 Encounter for screening mammogram for malignant neoplasm of breast: Secondary | ICD-10-CM | POA: Insufficient documentation

## 2022-09-06 ENCOUNTER — Other Ambulatory Visit (HOSPITAL_COMMUNITY): Payer: Self-pay | Admitting: Family Medicine

## 2022-09-06 DIAGNOSIS — R928 Other abnormal and inconclusive findings on diagnostic imaging of breast: Secondary | ICD-10-CM

## 2022-09-18 ENCOUNTER — Ambulatory Visit (HOSPITAL_COMMUNITY)
Admission: RE | Admit: 2022-09-18 | Discharge: 2022-09-18 | Disposition: A | Discharge status: Home / Self Care | Payer: 59 | Source: Ambulatory Visit | Attending: Family Medicine | Admitting: Family Medicine

## 2022-09-18 DIAGNOSIS — R928 Other abnormal and inconclusive findings on diagnostic imaging of breast: Secondary | ICD-10-CM | POA: Insufficient documentation

## 2022-09-18 DIAGNOSIS — R921 Mammographic calcification found on diagnostic imaging of breast: Secondary | ICD-10-CM | POA: Diagnosis not present

## 2022-09-18 DIAGNOSIS — N6489 Other specified disorders of breast: Secondary | ICD-10-CM | POA: Diagnosis not present

## 2023-06-17 ENCOUNTER — Other Ambulatory Visit (HOSPITAL_COMMUNITY): Payer: Self-pay | Admitting: Family Medicine

## 2023-06-17 DIAGNOSIS — Z1231 Encounter for screening mammogram for malignant neoplasm of breast: Secondary | ICD-10-CM

## 2023-06-18 ENCOUNTER — Other Ambulatory Visit (HOSPITAL_COMMUNITY): Payer: Self-pay | Admitting: Family Medicine

## 2023-06-18 DIAGNOSIS — R921 Mammographic calcification found on diagnostic imaging of breast: Secondary | ICD-10-CM

## 2023-06-25 ENCOUNTER — Encounter (HOSPITAL_COMMUNITY): Payer: 59

## 2023-06-25 ENCOUNTER — Ambulatory Visit (HOSPITAL_COMMUNITY): Payer: BLUE CROSS/BLUE SHIELD

## 2023-06-25 ENCOUNTER — Encounter (HOSPITAL_COMMUNITY): Payer: Self-pay
